# Patient Record
Sex: Male | Born: 2000 | Race: Black or African American | Hispanic: No | Marital: Single
Health system: Southern US, Community
[De-identification: ages and names within clinical notes are randomized; demographics above are authoritative.]

---
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
## 2019-05-18 ENCOUNTER — Other Ambulatory Visit: Payer: Self-pay

## 2019-05-18 DIAGNOSIS — Z20822 Contact with and (suspected) exposure to covid-19: Secondary | ICD-10-CM

## 2019-05-20 LAB — NOVEL CORONAVIRUS, NAA: SARS-CoV-2, NAA: NOT DETECTED

## 2022-02-26 ENCOUNTER — Encounter (HOSPITAL_BASED_OUTPATIENT_CLINIC_OR_DEPARTMENT_OTHER): Payer: Self-pay | Admitting: Pediatrics

## 2022-02-26 ENCOUNTER — Emergency Department (HOSPITAL_BASED_OUTPATIENT_CLINIC_OR_DEPARTMENT_OTHER)
Admission: EM | Admit: 2022-02-26 | Discharge: 2022-02-26 | Disposition: A | Discharge status: Home / Self Care | Payer: Medicaid Other | Attending: Emergency Medicine | Admitting: Emergency Medicine

## 2022-02-26 ENCOUNTER — Other Ambulatory Visit: Payer: Self-pay

## 2022-02-26 DIAGNOSIS — F17200 Nicotine dependence, unspecified, uncomplicated: Secondary | ICD-10-CM | POA: Diagnosis not present

## 2022-02-26 DIAGNOSIS — S3992XA Unspecified injury of lower back, initial encounter: Secondary | ICD-10-CM | POA: Diagnosis present

## 2022-02-26 DIAGNOSIS — X503XXA Overexertion from repetitive movements, initial encounter: Secondary | ICD-10-CM | POA: Diagnosis not present

## 2022-02-26 DIAGNOSIS — Y990 Civilian activity done for income or pay: Secondary | ICD-10-CM | POA: Diagnosis not present

## 2022-02-26 DIAGNOSIS — S39012A Strain of muscle, fascia and tendon of lower back, initial encounter: Secondary | ICD-10-CM | POA: Diagnosis not present

## 2022-02-26 NOTE — ED Triage Notes (Signed)
C/O low back pain started last night; endorsed lifting heavy items for work,

## 2022-02-26 NOTE — ED Provider Notes (Signed)
[facility] EMERGENCY DEPARTMENT Provider Note   CSN: [id number] Arrival date & time: 02/26/22  1645     History  Chief Complaint  Patient presents with   Back Pain    Chin Reason is a 21 y.o. male who presents the emergency department complaining of lower back pain starting last night.  Patient states that he works at [employer] and is often lifting heavy boxes.  He thinks that yesterday while at work he might of lifted something heavier/lifted incorrectly.  He took a hot shower last night and got some relief, took some ibuprofen earlier today.  Pain does not radiate anywhere.  Denies any fever or recent illness.  Denies any injuries.  He is a frequent smoker, no alcohol or recreational drug use.  Back Pain Associated symptoms: no abdominal pain, no dysuria, no fever, no numbness and no weakness        Home Medications Prior to Admission medications   Medication Sig Start Date End Date Taking? Authorizing Provider  albuterol (PROVENTIL) (2.5 MG/3ML) 0.083% nebulizer solution Take 2.5 mg by nebulization every 6 (six) hours as needed.    [provider]  diphenhydrAMINE-zinc acetate (BENADRYL) cream Apply topically daily as needed. For itching    [provider]  ibuprofen (ADVIL,MOTRIN) 600 MG tablet Take 1 tablet (600 mg total) by mouth every 6 (six) hours as needed for moderate pain. 05/02/14   Lowanda Foster, NP  permethrin (ELIMITE) 5 % cream Use as directed on package, repeat in 1 week. 05/18/12   Linna Hoff, MD      Allergies    Patient has no known allergies.    Review of Systems   Review of Systems  Constitutional:  Negative for chills and fever.  Gastrointestinal:  Negative for abdominal pain.  Genitourinary:  Negative for dysuria and flank pain.  Musculoskeletal:  Positive for back pain.  Neurological:  Negative for weakness and numbness.  All other systems reviewed and are negative.   Physical Exam Updated Vital Signs BP 103/60 (BP  Location: Left Arm)   Pulse 66   Temp 97.9 F (36.6 C) (Oral)   Resp 18   Ht 6\' 4"  (1.93 m)   Wt 95.3 kg   SpO2 99%   BMI 25.56 kg/m  Physical Exam Vitals and nursing note reviewed.  Constitutional:      Appearance: Normal appearance.  HENT:     Head: Normocephalic and atraumatic.  Eyes:     Conjunctiva/sclera: Conjunctivae normal.  Pulmonary:     Effort: Pulmonary effort is normal. No respiratory distress.  Musculoskeletal:     Comments: Full passive ROM of all regions of spine.  Paraspinal muscular tenderness to palpation in left thoracic region.  No midline spinal tenderness, step-offs or crepitus.  Strength 5/5 in all extremities.  Sensation intact in all extremities.  Skin:    General: Skin is warm and dry.  Neurological:     Mental Status: He is alert.  Psychiatric:        Mood and Affect: Mood normal.        Behavior: Behavior normal.     ED Results / Procedures / Treatments   Labs (all labs ordered are listed, but only abnormal results are displayed) Labs Reviewed - No data to display  EKG None  Radiology No results found.  Procedures Procedures    Medications Ordered in ED Medications - No data to display  ED Course/ Medical Decision Making/ A&P  Medical Decision Making Patient is a 21 year old male with no significant past medical history presents the emergency department complaining of lower back pain starting last night.  Patient works at [employer] and often lifts heavy boxes.  No recent illness or injury.  Patient is a frequent smoker, no alcohol or recreational drug use.  On exam patient has no midline spinal tenderness, step-offs or crepitus.  He does have paraspinal muscular tenderness palpation in the left-sided thoracic region.  Normal range of motion of entire spine, normal strength and sensation of all extremities.  No concern for myelopathy.  Patient symptoms consistent with a muscular strain.  No red flag symptoms.   Will recommend over-the-counter medications, heating pad or hot showers, and topical ointment such as Voltaren or IcyHot.  Will discharge home and give close return precautions.  Patient is agreeable to plan and all questions answered.  Final Clinical Impression(s) / ED Diagnoses Final diagnoses:  Back strain, initial encounter    Rx / DC Orders ED Discharge Orders     None      Portions of this report may have been transcribed using voice recognition software. Every effort was made to ensure accuracy; however, inadvertent computerized transcription errors may be present.    Jakin Pavao T, PA-C 02/26/22 1753    Franne Forts, DO 02/28/22 [phone]

## 2022-02-26 NOTE — ED Notes (Signed)
Discharge instructions reviewed with patient. Patient verbalizes understanding, no further questions at this time. Medications and follow up information provided. No acute distress noted at time of departure.  

## 2022-02-26 NOTE — Discharge Instructions (Addendum)
You were seen emergency department today for back pain.  As we discussed I think your symptoms are like related to sore muscles.  I recommend you continue the ibuprofen, and you can take Tylenol as well if you feel you need more medication.  You can use things like IcyHot or lidocaine patches from the drugstore.  Can also use heating pad or take hot showers for muscle relief.  Continue to monitor how you're doing and return to the ER for new or worsening symptoms.
